# Patient Record
Sex: Female | Born: 1945 | Race: White | Hispanic: No | Marital: Married | Smoking: Never smoker
Health system: Southern US, Community
[De-identification: ages and names within clinical notes are randomized; demographics above are authoritative.]

---

## 2009-06-06 ENCOUNTER — Other Ambulatory Visit: Admission: RE | Admit: 2009-06-06 | Discharge: 2009-06-06 | Payer: Self-pay | Admitting: *Deleted

## 2015-11-09 ENCOUNTER — Ambulatory Visit (INDEPENDENT_AMBULATORY_CARE_PROVIDER_SITE_OTHER): Payer: Medicare Other | Admitting: Podiatry

## 2015-11-09 ENCOUNTER — Ambulatory Visit (INDEPENDENT_AMBULATORY_CARE_PROVIDER_SITE_OTHER): Payer: Medicare Other

## 2015-11-09 VITALS — BP 200/117 | HR 98 | Resp 16

## 2015-11-09 DIAGNOSIS — M7662 Achilles tendinitis, left leg: Secondary | ICD-10-CM

## 2015-11-09 DIAGNOSIS — M21619 Bunion of unspecified foot: Secondary | ICD-10-CM

## 2015-11-09 DIAGNOSIS — M722 Plantar fascial fibromatosis: Secondary | ICD-10-CM

## 2015-11-09 MED ORDER — TRIAMCINOLONE ACETONIDE 10 MG/ML IJ SUSP
10.0000 mg | Freq: Once | INTRAMUSCULAR | Status: AC
  Administered 2015-11-09: 10 mg

## 2015-11-09 NOTE — Progress Notes (Signed)
   Subjective:    Patient ID: Diane Oneill, female    DOB: Jul 23, 1946, 69 y.o.   MRN: [id number]  HPI  Pt presents with painful knot/cyst on her achilles tendon, posterior heel ongoing for several years, stiff and painful at times  Review of Systems  All other systems reviewed and are negative.      Objective:   Physical Exam        Assessment & Plan:

## 2015-11-09 NOTE — Progress Notes (Signed)
Subjective:     Patient ID: Diane Oneill, female   DOB: 1946-03-01, 69 y.o.   MRN: [id number]  HPI patient states I've had a lot of pain in the back of my left heel for the last several years and it is moderately swollen and I've tried to change shoe gear   Review of Systems  All other systems reviewed and are negative.      Objective:   Physical Exam  Constitutional: She is oriented to person, place, and time.  Cardiovascular: Intact distal pulses.   Musculoskeletal: Normal range of motion.  Neurological: She is oriented to person, place, and time.  Skin: Skin is warm.  Nursing note and vitals reviewed.  neurovascular status intact muscle strength adequate range of motion within normal limits with patient noted to have posterior inflammation medial side left Achilles that's tender when pressed with a central and lateral band only being mildly discomforting. 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 Patient tolerated the procedure well and will be seen back for us to recheck again and we also discussed possibility for shockwave depending on response

## 2015-11-23 ENCOUNTER — Ambulatory Visit (INDEPENDENT_AMBULATORY_CARE_PROVIDER_SITE_OTHER): Payer: Medicare Other | Admitting: Podiatry

## 2015-11-23 ENCOUNTER — Telehealth: Payer: Self-pay | Admitting: *Deleted

## 2015-11-23 ENCOUNTER — Encounter: Payer: Self-pay | Admitting: Podiatry

## 2015-11-23 VITALS — BP 222/122 | HR 110 | Resp 16

## 2015-11-23 DIAGNOSIS — M7662 Achilles tendinitis, left leg: Secondary | ICD-10-CM

## 2015-11-23 MED ORDER — NONFORMULARY OR COMPOUNDED ITEM: Status: DC

## 2015-11-23 NOTE — Patient Instructions (Signed)

## 2015-11-23 NOTE — Telephone Encounter (Signed)
rx faxed to [facility]. 

## 2015-11-24 NOTE — Progress Notes (Signed)
Subjective:     Patient ID: Diane Oneill, female   DOB: 1946/09/17, 69 y.o.   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 I did spent a great of time educating there is no guarantee this will solve and the benefits of shockwave and at this point we will start this procedure next week. I did dispense a short air fracture walker today to immobilize the Achilles and explained its usage and will see this patient back for treatment

## 2015-12-02 ENCOUNTER — Ambulatory Visit: Payer: Medicare Other | Admitting: Podiatry

## 2020-01-21 ENCOUNTER — Ambulatory Visit: Payer: Medicare HMO | Attending: Internal Medicine

## 2020-01-21 DIAGNOSIS — Z23 Encounter for immunization: Secondary | ICD-10-CM | POA: Insufficient documentation

## 2020-01-21 NOTE — Progress Notes (Signed)
   Covid-19 Vaccination Clinic  Name:  Eric Morganti    MRN: [id number] DOB: 24-Mar-1946  01/21/2020  Ms. Zechman was observed post Covid-19 immunization for 15 minutes without incidence. She was provided with Vaccine Information Sheet and instruction to access the V-Safe system.   Ms. Gravette was instructed to call 911 with any severe reactions post vaccine: Marland Kitchen Difficulty breathing  . Swelling of your face and throat  . A fast heartbeat  . A bad rash all over your body  . Dizziness and weakness    Immunizations Administered    Name Date Dose VIS Date Route   Pfizer COVID-19 Vaccine 01/21/2020  6:05 PM 0.3 mL 12/11/2019 Intramuscular   Manufacturer: [employer], [employer]   Lot: [id number]   NDC: [address]-5

## 2020-02-11 ENCOUNTER — Ambulatory Visit: Payer: Medicare HMO | Attending: Internal Medicine

## 2020-02-11 DIAGNOSIS — Z23 Encounter for immunization: Secondary | ICD-10-CM

## 2020-02-11 NOTE — Progress Notes (Signed)
   Covid-19 Vaccination Clinic  Name:  Diane Oneill    MRN: [id number] DOB: 04-03-1946  02/11/2020  Diane Oneill was observed post Covid-19 immunization for 15 minutes without incidence. She was provided with Vaccine Information Sheet and instruction to access the V-Safe system.   Diane Oneill was instructed to call 911 with any severe reactions post vaccine: Marland Kitchen Difficulty breathing  . Swelling of your face and throat  . A fast heartbeat  . A bad rash all over your body  . Dizziness and weakness    Immunizations Administered    Name Date Dose VIS Date Route   Pfizer COVID-19 Vaccine 02/11/2020 10:12 AM 0.3 mL 12/11/2019 Intramuscular   Manufacturer: [employer], [employer]   Lot: [id number]   NDC: [id number]

## 2021-12-27 DIAGNOSIS — M2550 Pain in unspecified joint: Secondary | ICD-10-CM | POA: Diagnosis not present

## 2021-12-27 DIAGNOSIS — F419 Anxiety disorder, unspecified: Secondary | ICD-10-CM | POA: Diagnosis not present

## 2021-12-27 DIAGNOSIS — I160 Hypertensive urgency: Secondary | ICD-10-CM | POA: Diagnosis not present

## 2021-12-27 DIAGNOSIS — F330 Major depressive disorder, recurrent, mild: Secondary | ICD-10-CM | POA: Diagnosis not present

## 2021-12-27 DIAGNOSIS — I10 Essential (primary) hypertension: Secondary | ICD-10-CM | POA: Diagnosis not present

## 2021-12-27 DIAGNOSIS — Z113 Encounter for screening for infections with a predominantly sexual mode of transmission: Secondary | ICD-10-CM | POA: Diagnosis not present

## 2021-12-27 DIAGNOSIS — E785 Hyperlipidemia, unspecified: Secondary | ICD-10-CM | POA: Diagnosis not present

## 2021-12-27 DIAGNOSIS — I5030 Unspecified diastolic (congestive) heart failure: Secondary | ICD-10-CM | POA: Diagnosis not present

## 2021-12-27 DIAGNOSIS — H5789 Other specified disorders of eye and adnexa: Secondary | ICD-10-CM | POA: Diagnosis not present

## 2021-12-27 DIAGNOSIS — I4510 Unspecified right bundle-branch block: Secondary | ICD-10-CM | POA: Diagnosis not present

## 2022-01-02 ENCOUNTER — Other Ambulatory Visit (HOSPITAL_COMMUNITY): Payer: Self-pay | Admitting: Family Medicine

## 2022-01-02 DIAGNOSIS — I10 Essential (primary) hypertension: Secondary | ICD-10-CM

## 2022-01-02 DIAGNOSIS — R9431 Abnormal electrocardiogram [ECG] [EKG]: Secondary | ICD-10-CM

## 2022-01-16 ENCOUNTER — Other Ambulatory Visit: Payer: Self-pay

## 2022-01-16 ENCOUNTER — Ambulatory Visit (HOSPITAL_COMMUNITY)
Admission: RE | Admit: 2022-01-16 | Discharge: 2022-01-16 | Disposition: A | Discharge status: Home / Self Care | Payer: Medicare HMO | Source: Ambulatory Visit | Attending: Family Medicine | Admitting: Family Medicine

## 2022-01-16 ENCOUNTER — Other Ambulatory Visit: Payer: Self-pay | Admitting: Family Medicine

## 2022-01-16 DIAGNOSIS — I517 Cardiomegaly: Secondary | ICD-10-CM | POA: Insufficient documentation

## 2022-01-16 DIAGNOSIS — R9431 Abnormal electrocardiogram [ECG] [EKG]: Secondary | ICD-10-CM | POA: Diagnosis present

## 2022-01-16 DIAGNOSIS — I3139 Other pericardial effusion (noninflammatory): Secondary | ICD-10-CM | POA: Diagnosis not present

## 2022-01-16 DIAGNOSIS — I340 Nonrheumatic mitral (valve) insufficiency: Secondary | ICD-10-CM | POA: Insufficient documentation

## 2022-01-16 DIAGNOSIS — Z13820 Encounter for screening for osteoporosis: Secondary | ICD-10-CM

## 2022-01-16 DIAGNOSIS — I10 Essential (primary) hypertension: Secondary | ICD-10-CM | POA: Insufficient documentation

## 2022-01-16 LAB — ECHOCARDIOGRAM COMPLETE
Area-P 1/2: 4.13 cm2
S' Lateral: 2.9 cm

## 2022-02-08 ENCOUNTER — Ambulatory Visit
Admission: RE | Admit: 2022-02-08 | Discharge: 2022-02-08 | Disposition: A | Discharge status: Home / Self Care | Payer: Medicare HMO | Source: Ambulatory Visit | Attending: Family Medicine | Admitting: Family Medicine

## 2022-02-08 ENCOUNTER — Other Ambulatory Visit: Payer: Self-pay

## 2022-02-08 DIAGNOSIS — Z13820 Encounter for screening for osteoporosis: Secondary | ICD-10-CM

## 2022-03-09 NOTE — Progress Notes (Deleted)
? ?Office Visit Note ? ?Patient: Diane Oneill             ?Date of Birth: 30-Aug-1946           ?MRN: [id number]             ?PCP: Kathyrn Lass, MD ?Referring: Buzzy Han* ?Visit Date: 03/23/2022 ?Occupation: @GUAROCC @ ? ?Subjective:  ?No chief complaint on file. ? ? ?History of Present Illness: Diane Oneill is a 76 y.o. female ***  ? ?Activities of Daily Living:  ?Patient reports morning stiffness for *** {minute/hour:19697}.   ?Patient {ACTIONS;DENIES/REPORTS:21021675::"Denies"} nocturnal pain.  ?Difficulty dressing/grooming: {ACTIONS;DENIES/REPORTS:21021675::"Denies"} ?Difficulty climbing stairs: {ACTIONS;DENIES/REPORTS:21021675::"Denies"} ?Difficulty getting out of chair: {ACTIONS;DENIES/REPORTS:21021675::"Denies"} ?Difficulty using hands for taps, buttons, cutlery, and/or writing: {ACTIONS;DENIES/REPORTS:21021675::"Denies"} ? ?No Rheumatology ROS completed.  ? ?PMFS History:  ?There are no problems to display for this patient. ?  ?No past medical history on file.  ?No family history on file. ?*** The histories are not reviewed yet. Please review them in the "History" navigator section and refresh this [place]. ?Social History  ? ?Social History Narrative  ? Not on file  ? ?Immunization History  ?Administered Date(s) Administered  ? PFIZER(Purple Top)SARS-COV-2 Vaccination 01/21/2020, 02/11/2020  ?  ? ?Objective: ?Vital Signs: There were no vitals taken for this visit.  ? ?Physical Exam  ? ?Musculoskeletal Exam: *** ? ?CDAI Exam: ?CDAI Score: -- ?Patient Global: --; Provider Global: -- ?Swollen: --; Tender: -- ?Joint Exam 03/23/2022  ? ?No joint exam has been documented for this visit  ? ?There is currently no information documented on the homunculus. Go to the Rheumatology activity and complete the homunculus joint exam. ? ?Investigation: ?No additional findings. ? ?Imaging: ?DG MOBILE BONE DENSITY ? ?Result Date: 02/09/2022 ?CLINICAL DATA:  76 year old postmenopausal Caucasian female. Currently  undergoing vitamin-D supplementation. Baseline examination. EXAM: DUAL X-RAY ABSORPTIOMETRY (DXA) FOR BONE MINERAL DENSITY TECHNIQUE: Bone mineral density measurements are performed of the spine, hip, and forearm, as appropriate, per International Society of Clinical Densitometry recommendations. The pertinent regions of interest are reported below. Non-contributory values are not reported. Images are obtained for bone mineral density measurement and are not obtained for diagnostic purposes. FINDINGS: AP LUMBAR SPINE (L1-L4) Bone Mineral Density (BMD):  1.144 g/cm2 Young Adult T-Score:  0.9 Z-Score:  3.4 LEFT FEMUR NECK Bone Mineral Density (BMD):  0.856 g/cm2 Young Adult T-Score: 0.1 Z-Score:  2.2 Unit: This study was performed at [facility] on the [facility] (S/N ([phone]), software version 13.4.2. Scan quality: The scan quality is good. Exclusions: None. ASSESSMENT: Patient's diagnostic category is NORMAL by WHO Criteria. FRACTURE RISK: NOT INCREASED. FRAX: World Health Organization FRAX assessment of absolute fracture risk is not calculated for this patient because the patient has normal bone mineral density with all T-scores at or above -1.0. COMPARISON: None. RECOMMENDATIONS 1. All patients should optimize calcium and vitamin D intake. 2. Consider FDA-approved medical therapies in postmenopausal women and men aged 110 years and older, based on the following: - A hip or vertebral (clinical or morphometric) fracture - T-score less than or equal to -2.5 at the femoral neck or spine after appropriate evaluation to exclude secondary causes - Low bone mass (T-score between -1.0 and -2.5 at the femoral neck or spine) and a 10-year probability of a hip fracture greater than or equal to 3% or a 10-year probability of a major osteoporosis-related fracture greater than or equal to 20% based on the [place]-adapted WHO algorithm - Clinician judgment and/or patient preferences may indicate treatment for people  with  10-year fracture probabilities above or below these levels 3. Patients with diagnosis of osteoporosis or at high risk for fracture should have regular bone mineral density tests. For patients eligible for Medicare, routine testing is allowed once every 2 years. The testing frequency can be increased to one year for patients who have rapidly progressing disease, those who are receiving or discontinuing medical therapy to restore bone mass, or have additional risk factors. Electronically Signed   By: Evangeline Dakin M.D.   On: 02/09/2022 08:17   ? ?Recent Labs: ?No results found for: WBC, HGB, PLT, NA, K, CL, CO2, GLUCOSE, BUN, CREATININE, BILITOT, ALKPHOS, AST, ALT, PROT, ALBUMIN, CALCIUM, GFRAA, QFTBGOLD, QFTBGOLDPLUS ? ?Speciality Comments: No specialty comments available. ? ?Procedures:  ?No procedures performed ?Allergies: Patient has no known allergies.  ? ?Assessment / Plan:     ?Visit Diagnoses: Positive ANA (antinuclear antibody) - ANA+, ESR 33, CRP 57.8, RF<14, Anti-CCP<16, chromatin ab+ >8, Sm/RNP<1, dsDNA 24, Sm<1, Ro-, La-, RNP-, mg 2 ? ?Elevated C-reactive protein (CRP) ? ?Essential hypertension ? ?Chronic diastolic heart failure ([place]) ? ?GAD (generalized anxiety disorder) ? ?History of depression ? ?Orders: ?No orders of the defined types were placed in this encounter. ? ?No orders of the defined types were placed in this encounter. ? ? ?Face-to-face time spent with patient was *** minutes. Greater than 50% of time was spent in counseling and coordination of care. ? ?Follow-Up Instructions: No follow-ups on file. ? ? ?Ofilia Neas, PA-C ? ?Note - This record has been created using [employer].  ?Chart creation errors have been sought, but may not always  ?have been located. Such creation errors do not reflect on  ?the standard of medical care. ? ?

## 2022-03-23 ENCOUNTER — Ambulatory Visit: Payer: Medicare HMO | Admitting: Rheumatology

## 2022-03-23 DIAGNOSIS — R768 Other specified abnormal immunological findings in serum: Secondary | ICD-10-CM

## 2022-03-23 DIAGNOSIS — I5032 Chronic diastolic (congestive) heart failure: Secondary | ICD-10-CM

## 2022-03-23 DIAGNOSIS — Z8659 Personal history of other mental and behavioral disorders: Secondary | ICD-10-CM

## 2022-03-23 DIAGNOSIS — F411 Generalized anxiety disorder: Secondary | ICD-10-CM

## 2022-03-23 DIAGNOSIS — R7982 Elevated C-reactive protein (CRP): Secondary | ICD-10-CM

## 2022-03-23 DIAGNOSIS — I10 Essential (primary) hypertension: Secondary | ICD-10-CM

## 2022-03-23 NOTE — Progress Notes (Signed)
? ?Office Visit Note ? ?Patient: Diane Oneill             ?Date of Birth: 01/11/1946           ?MRN: [id number]             ?PCP: Reddy, Mahitha, MD ?Referring: [place]a-Anyim, Ahunna* ?Visit Date: 04/05/2022 ?Occupation: @GUAROCC@ ? ?Subjective:  ?Pain in joints and abnormal labs. ? ?History of Present Illness: Diane Oneill is a 75 y.o. female seen in consultation per request of her PCP.  According the patient in November 2022 she developed redness in her eyes.  A week later she started having joint pain and joint stiffness.  She states that she was having swelling in her both hands, left knee, ankles and her feet.  She states symptoms gradually improved over the next 2 months and resolved.  She has had chronic discomfort in her right knee joint which is still continues to hurt.  She experiences some tightness in her ankles.  She states at the time she was seen by her PCP who did extensive labs and her labs came positive for ANA and double-stranded DNA.  She went for a follow-up visit and at that time HLA-B27 was tested which was positive per patient.  She was referred to an ophthalmologist and was placed on prednisone eyedrops and her eye redness resolved.  She was also diagnosed with increased intraocular pressure for which she is using eyedrops.  She had an echocardiogram.  She states she was told that she may have had reactive arthritis.  She had no recurrence of that joint pain.  She denies history of fatigue, oral ulcers, nasal ulcers, malar rash, photosensitivity, Raynaud's phenomenon, lymphadenopathy.  She is gravida 3, para 3.  There is no family history of autoimmune disease or inflammatory bowel disease or psoriasis.  There is no history of DVTs. ? ?Activities of Daily Living:  ?Patient reports morning stiffness for 0 minutes.   ?Patient Denies nocturnal pain.  ?Difficulty dressing/grooming: Denies ?Difficulty climbing stairs: Denies ?Difficulty getting out of chair: Denies ?Difficulty using hands for  taps, buttons, cutlery, and/or writing: Denies ? ?Review of Systems  ?Constitutional:  Negative for fatigue.  ?HENT:  Negative for mouth sores, mouth dryness and nose dryness.   ?Eyes:  Negative for pain, itching and dryness.  ?Respiratory:  Negative for shortness of breath and difficulty breathing.   ?Cardiovascular:  Negative for chest pain and palpitations.  ?Gastrointestinal:  Negative for blood in stool, constipation and diarrhea.  ?Endocrine: Negative for increased urination.  ?Genitourinary:  Negative for difficulty urinating.  ?Musculoskeletal:  Positive for joint pain and joint pain. Negative for joint swelling, myalgias, morning stiffness, muscle tenderness and myalgias.  ?Skin:  Negative for color change, rash, redness and sensitivity to sunlight.  ?Allergic/Immunologic: Negative for susceptible to infections.  ?Neurological:  Negative for dizziness, numbness, headaches, memory loss and weakness.  ?Hematological:  Negative for bruising/bleeding tendency and swollen glands.  ?Psychiatric/Behavioral:  Negative for confusion and sleep disturbance. The patient is nervous/anxious.   ? ?PMFS History:  ?Patient Active Problem List  ? Diagnosis Date Noted  ? Chronic diastolic heart failure (HCC) 04/05/2022  ? Essential hypertension 04/05/2022  ? Anxiety and depression 04/05/2022  ?  ?History reviewed. No pertinent past medical history.  ?Family History  ?Problem Relation Age of Onset  ? Migraines Mother   ? Dementia Mother   ? Healthy Son   ? Healthy Daughter   ? Healthy Daughter   ? Migraines Daughter   ? ?  Past Surgical History:  ?Procedure Laterality Date  ? CESAREAN SECTION    ? x3  ? ?Social History  ? ?Social History Narrative  ? Not on file  ? ?Immunization History  ?Administered Date(s) Administered  ? PFIZER(Purple Top)SARS-COV-2 Vaccination 01/21/2020, 02/11/2020  ?  ? ?Objective: ?Vital Signs: BP (!) 174/90 (BP Location: Right Arm, Patient Position: Sitting, Cuff Size: Normal)   Pulse 76   Ht 5' 2.5"  (1.588 m)   Wt 200 lb 6.4 oz (90.9 kg)   BMI 36.07 kg/m?   ? ?Physical Exam ?Vitals and nursing note reviewed.  ?Constitutional:   ?   Appearance: She is well-developed.  ?HENT:  ?   Head: Normocephalic and atraumatic.  ?Eyes:  ?   Conjunctiva/sclera: Conjunctivae normal.  ?Cardiovascular:  ?   Rate and Rhythm: Normal rate and regular rhythm.  ?   Heart sounds: Normal heart sounds.  ?Pulmonary:  ?   Effort: Pulmonary effort is normal.  ?   Breath sounds: Normal breath sounds.  ?Abdominal:  ?   General: Bowel sounds are normal.  ?   Palpations: Abdomen is soft.  ?Musculoskeletal:  ?   Cervical back: Normal range of motion.  ?Lymphadenopathy:  ?   Cervical: No cervical adenopathy.  ?Skin: ?   General: Skin is warm and dry.  ?   Capillary Refill: Capillary refill takes less than 2 seconds.  ?Neurological:  ?   Mental Status: She is alert and oriented to person, place, and time.  ?Psychiatric:     ?   Behavior: Behavior normal.  ?  ? ?Musculoskeletal Exam: C-spine was in good range of motion.  She had no tenderness over thoracic or lumbar spine.  She had good mobility in the thoracic and lumbar spine.  She had no tenderness over SI joints.  Shoulder joints, elbow joints, wrist joints with good range of motion.  She had bilateral PIP and DIP thickening with no synovitis.  Hip joints were in good range of motion.  She has warmth on palpation of bilateral knee joints with incomplete extension.  There was no tenderness over ankles or MTPs.  There was no evidence of Achilles tendinitis or planter fasciitis. ? ?CDAI Exam: ?CDAI Score: -- ?Patient Global: --; Provider Global: -- ?Swollen: --; Tender: -- ?Joint Exam 04/05/2022  ? ?No joint exam has been documented for this visit  ? ?There is currently no information documented on the homunculus. Go to the Rheumatology activity and complete the homunculus joint exam. ? ?Investigation: ?No additional findings. ? ?Imaging: ?No results found. ? ?Recent Labs: ?No results found  for: WBC, HGB, PLT, NA, K, CL, CO2, GLUCOSE, BUN, CREATININE, BILITOT, ALKPHOS, AST, ALT, PROT, ALBUMIN, CALCIUM, GFRAA, QFTBGOLD, QFTBGOLDPLUS ? ? ?December 27, 2021 lipid panel normal except low HDL at 46, CMP GFR 93 AST 31 ALT 23, vitamin D 43, magnesium 2.0, ESR 33, CBC WBC 5.3 hemoglobin 11.4, platelets 197, ANA positive, dsDNA 25 high, anti-CCP negative, RF negative, CRP 57.8, RPR - ?01/15/2022 HLA-B27 positive ?Speciality Comments: No specialty comments available. ? ?Procedures:  ?No procedures performed ?Allergies: Patient has no known allergies.  ? ?Assessment / Plan:     ?Visit Diagnoses: Positive ANA (antinuclear antibody) -patient states that she developed her redness in her eyes followed by pain and swelling in multiple joints in November.  The swelling in her joints lasted for about 2 months and then resolved.  Her labs were obtained by her PCP which showed positive ANA and positive double-stranded DNA.    Her sed rate and C-reactive protein were also elevated.  She denies any history of fatigue, oral ulcers, nasal ulcers, malar rash, photosensitivity, Raynaud's phenomenon or lymphadenopathy.  There is no family history of autoimmune disease.  I will obtain following labs today.  12/27/21:ANA+, dsDNA 24, Sm>1, Sm/RNP-, Chromatin >8, anti-CCP<16, RF<14, CRP 57.8, Ro-, La-, ESR 33 - Plan: Urinalysis, Routine w reflex microscopic, Sedimentation rate, ANA, RNP Antibody, Anti-DNA antibody, double-stranded, C3 and C4, Beta-2 glycoprotein antibodies, Cardiolipin antibodies, IgG, IgM, IgA, Lupus Anticoagulant Eval w/Reflex, Glucose 6 phosphate dehydrogenase ? ?Polyarthralgia-possible reactive arthritis.  Patient developed conjunctivitis, inflammatory arthritis lasting for about 2 months and she was HLA-B27 positive.  Patient states that she presented with joint swelling and inflammation in multiple joints 2 weeks after redness in her eyes.  The symptoms lasted for about 2 months and then resolved.  She states  she does not have any joint swelling now.  However, I noticed warmth in her bilateral knee joints. ? ?HLA-B27 positive-patient states a possibility of reactive arthritis was discussed with her.  I would like t

## 2022-04-05 ENCOUNTER — Ambulatory Visit (INDEPENDENT_AMBULATORY_CARE_PROVIDER_SITE_OTHER): Payer: Medicare HMO

## 2022-04-05 ENCOUNTER — Encounter: Payer: Self-pay | Admitting: Rheumatology

## 2022-04-05 ENCOUNTER — Ambulatory Visit: Payer: Medicare HMO | Admitting: Rheumatology

## 2022-04-05 VITALS — BP 174/90 | HR 76 | Ht 62.5 in | Wt 200.4 lb

## 2022-04-05 DIAGNOSIS — I10 Essential (primary) hypertension: Secondary | ICD-10-CM

## 2022-04-05 DIAGNOSIS — M25561 Pain in right knee: Secondary | ICD-10-CM

## 2022-04-05 DIAGNOSIS — M25562 Pain in left knee: Secondary | ICD-10-CM

## 2022-04-05 DIAGNOSIS — M79641 Pain in right hand: Secondary | ICD-10-CM

## 2022-04-05 DIAGNOSIS — M79671 Pain in right foot: Secondary | ICD-10-CM

## 2022-04-05 DIAGNOSIS — G8929 Other chronic pain: Secondary | ICD-10-CM

## 2022-04-05 DIAGNOSIS — M79672 Pain in left foot: Secondary | ICD-10-CM | POA: Diagnosis not present

## 2022-04-05 DIAGNOSIS — H5789 Other specified disorders of eye and adnexa: Secondary | ICD-10-CM

## 2022-04-05 DIAGNOSIS — M2550 Pain in unspecified joint: Secondary | ICD-10-CM

## 2022-04-05 DIAGNOSIS — F419 Anxiety disorder, unspecified: Secondary | ICD-10-CM

## 2022-04-05 DIAGNOSIS — Z1589 Genetic susceptibility to other disease: Secondary | ICD-10-CM

## 2022-04-05 DIAGNOSIS — R768 Other specified abnormal immunological findings in serum: Secondary | ICD-10-CM

## 2022-04-05 DIAGNOSIS — H40053 Ocular hypertension, bilateral: Secondary | ICD-10-CM

## 2022-04-05 DIAGNOSIS — M79642 Pain in left hand: Secondary | ICD-10-CM

## 2022-04-05 DIAGNOSIS — I5032 Chronic diastolic (congestive) heart failure: Secondary | ICD-10-CM

## 2022-04-05 DIAGNOSIS — F32A Depression, unspecified: Secondary | ICD-10-CM

## 2022-04-08 NOTE — Progress Notes (Signed)
I will discuss results at the follow-up visit.

## 2022-04-11 LAB — RNP ANTIBODY: Ribonucleic Protein(ENA) Antibody, IgG: <1 AI

## 2022-04-11 LAB — BETA-2 GLYCOPROTEIN ANTIBODIES
Beta-2 Glyco 1 IgA: <2 U/mL (ref <20.0)
Beta-2 Glyco 1 IgM: <2 U/mL (ref <20.0)
Beta-2 Glyco I IgG: <2 U/mL (ref <20.0)

## 2022-04-11 LAB — CARDIOLIPIN ANTIBODIES, IGG, IGM, IGA
Anticardiolipin IgA: <2 APL-U/mL (ref <20.0)
Anticardiolipin IgG: <2 GPL-U/mL (ref <20.0)
Anticardiolipin IgM: <2 MPL-U/mL (ref <20.0)

## 2022-04-11 LAB — URIC ACID: Uric Acid, Serum: 6.3 mg/dL (ref 2.5–7.0)

## 2022-04-11 LAB — URINALYSIS, ROUTINE W REFLEX MICROSCOPIC
Bilirubin Urine: NEGATIVE
Glucose, UA: NEGATIVE
Hgb urine dipstick: NEGATIVE
Ketones, ur: NEGATIVE
Leukocytes,Ua: NEGATIVE
Nitrite: NEGATIVE
Protein, ur: NEGATIVE
Specific Gravity, Urine: 1.018 (ref 1.001–1.035)
pH: 7 (ref 5.0–8.0)

## 2022-04-11 LAB — C3 AND C4
C3 Complement: 132 mg/dL (ref 83–193)
C4 Complement: 35 mg/dL (ref 15–57)

## 2022-04-11 LAB — ANTI-NUCLEAR AB-TITER (ANA TITER): ANA Titer 1: 1:80 {titer} — ABNORMAL HIGH

## 2022-04-11 LAB — SEDIMENTATION RATE: Sed Rate: 25 mm/h (ref 0–30)

## 2022-04-11 LAB — GLUCOSE 6 PHOSPHATE DEHYDROGENASE: G-6PDH: 13.3 U/g Hgb (ref 7.0–20.5)

## 2022-04-11 LAB — ANTI-DNA ANTIBODY, DOUBLE-STRANDED: ds DNA Ab: 1 IU/mL

## 2022-04-11 LAB — CYCLIC CITRUL PEPTIDE ANTIBODY, IGG: Cyclic Citrullin Peptide Ab: <16 UNITS

## 2022-04-11 LAB — ANA: Anti Nuclear Antibody (ANA): POSITIVE — AB

## 2022-04-18 ENCOUNTER — Ambulatory Visit: Payer: Medicare HMO | Admitting: Rheumatology

## 2022-05-17 ENCOUNTER — Ambulatory Visit: Payer: Medicare HMO | Admitting: Rheumatology
